# Patient Record
Sex: Female | Born: 1955 | Race: White | Hispanic: No | Marital: Married | Smoking: Never smoker
Health system: Southern US, Community
[De-identification: ages and names within clinical notes are randomized; demographics above are authoritative.]

## PROBLEM LIST (undated history)

## (undated) DIAGNOSIS — E559 Vitamin D deficiency, unspecified: Secondary | ICD-10-CM

## (undated) DIAGNOSIS — I4891 Unspecified atrial fibrillation: Secondary | ICD-10-CM

## (undated) DIAGNOSIS — I2089 Other forms of angina pectoris: Secondary | ICD-10-CM

## (undated) DIAGNOSIS — K219 Gastro-esophageal reflux disease without esophagitis: Secondary | ICD-10-CM

## (undated) DIAGNOSIS — I208 Other forms of angina pectoris: Secondary | ICD-10-CM

## (undated) DIAGNOSIS — N950 Postmenopausal bleeding: Secondary | ICD-10-CM

## (undated) DIAGNOSIS — F419 Anxiety disorder, unspecified: Secondary | ICD-10-CM

## (undated) DIAGNOSIS — N951 Menopausal and female climacteric states: Secondary | ICD-10-CM

## (undated) DIAGNOSIS — R339 Retention of urine, unspecified: Secondary | ICD-10-CM

## (undated) DIAGNOSIS — J069 Acute upper respiratory infection, unspecified: Secondary | ICD-10-CM

## (undated) DIAGNOSIS — Z22322 Carrier or suspected carrier of Methicillin resistant Staphylococcus aureus: Secondary | ICD-10-CM

## (undated) DIAGNOSIS — J309 Allergic rhinitis, unspecified: Secondary | ICD-10-CM

## (undated) DIAGNOSIS — K9041 Non-celiac gluten sensitivity: Secondary | ICD-10-CM

## (undated) DIAGNOSIS — F39 Unspecified mood [affective] disorder: Secondary | ICD-10-CM

## (undated) HISTORY — PX: BREAST CYST ASPIRATION: SHX578

## (undated) HISTORY — DX: Unspecified mood (affective) disorder: F39

## (undated) HISTORY — PX: DILATION AND CURETTAGE, DIAGNOSTIC / THERAPEUTIC: SUR384

## (undated) HISTORY — DX: Allergic rhinitis, unspecified: J30.9

## (undated) HISTORY — PX: CHOLECYSTECTOMY: SHX55

## (undated) HISTORY — DX: Postmenopausal bleeding: N95.0

## (undated) HISTORY — DX: Vitamin D deficiency, unspecified: E55.9

## (undated) HISTORY — DX: Other forms of angina pectoris: I20.8

## (undated) HISTORY — DX: Retention of urine, unspecified: R33.9

## (undated) HISTORY — DX: Other forms of angina pectoris: I20.89

## (undated) HISTORY — PX: EXPLORATORY LAPAROTOMY: SUR591

## (undated) HISTORY — DX: Unspecified atrial fibrillation: I48.91

## (undated) HISTORY — DX: Acute upper respiratory infection, unspecified: J06.9

## (undated) HISTORY — PX: TONSILLECTOMY AND ADENOIDECTOMY: SUR1326

## (undated) HISTORY — DX: Carrier or suspected carrier of methicillin resistant Staphylococcus aureus: Z22.322

## (undated) HISTORY — DX: Menopausal and female climacteric states: N95.1

## (undated) HISTORY — DX: Non-celiac gluten sensitivity: K90.41

## (undated) HISTORY — DX: Anxiety disorder, unspecified: F41.9

## (undated) HISTORY — DX: Gastro-esophageal reflux disease without esophagitis: K21.9

---

## 1997-05-10 ENCOUNTER — Other Ambulatory Visit: Admission: RE | Admit: 1997-05-10 | Discharge: 1997-05-10 | Payer: Self-pay | Admitting: Gynecology

## 1997-05-13 ENCOUNTER — Other Ambulatory Visit: Admission: RE | Admit: 1997-05-13 | Discharge: 1997-05-13 | Payer: Self-pay | Admitting: Gynecology

## 1997-05-17 ENCOUNTER — Other Ambulatory Visit: Admission: RE | Admit: 1997-05-17 | Discharge: 1997-05-17 | Payer: Self-pay | Admitting: Gynecology

## 1997-08-11 ENCOUNTER — Ambulatory Visit (HOSPITAL_COMMUNITY): Admission: RE | Admit: 1997-08-11 | Discharge: 1997-08-11 | Payer: Self-pay | Admitting: Obstetrics and Gynecology

## 1997-11-29 ENCOUNTER — Inpatient Hospital Stay (HOSPITAL_COMMUNITY): Admission: AD | Admit: 1997-11-29 | Discharge: 1997-11-29 | Payer: Self-pay | Admitting: Obstetrics and Gynecology

## 1997-12-01 ENCOUNTER — Inpatient Hospital Stay (HOSPITAL_COMMUNITY): Admission: AD | Admit: 1997-12-01 | Discharge: 1997-12-01 | Payer: Self-pay | Admitting: *Deleted

## 1997-12-02 ENCOUNTER — Inpatient Hospital Stay (HOSPITAL_COMMUNITY): Admission: AD | Admit: 1997-12-02 | Discharge: 1997-12-02 | Payer: Self-pay

## 1997-12-30 ENCOUNTER — Inpatient Hospital Stay (HOSPITAL_COMMUNITY): Admission: AD | Admit: 1997-12-30 | Discharge: 1998-01-02 | Payer: Self-pay | Admitting: Obstetrics & Gynecology

## 1998-01-05 ENCOUNTER — Encounter (HOSPITAL_COMMUNITY): Admission: RE | Admit: 1998-01-05 | Discharge: 1998-04-05 | Payer: Self-pay | Admitting: Obstetrics and Gynecology

## 1998-11-17 ENCOUNTER — Ambulatory Visit (HOSPITAL_COMMUNITY): Admission: RE | Admit: 1998-11-17 | Discharge: 1998-11-17 | Payer: Self-pay | Admitting: Obstetrics and Gynecology

## 1998-11-17 ENCOUNTER — Encounter: Payer: Self-pay | Admitting: Obstetrics and Gynecology

## 1999-04-11 ENCOUNTER — Other Ambulatory Visit: Admission: RE | Admit: 1999-04-11 | Discharge: 1999-04-11 | Payer: Self-pay | Admitting: Obstetrics and Gynecology

## 1999-11-22 ENCOUNTER — Ambulatory Visit (HOSPITAL_COMMUNITY): Admission: RE | Admit: 1999-11-22 | Discharge: 1999-11-22 | Payer: Self-pay | Admitting: Obstetrics and Gynecology

## 1999-11-22 ENCOUNTER — Encounter: Payer: Self-pay | Admitting: Obstetrics and Gynecology

## 1999-11-29 ENCOUNTER — Encounter: Payer: Self-pay | Admitting: Obstetrics and Gynecology

## 1999-11-29 ENCOUNTER — Encounter: Admission: RE | Admit: 1999-11-29 | Discharge: 1999-11-29 | Payer: Self-pay | Admitting: Obstetrics and Gynecology

## 2000-07-29 ENCOUNTER — Other Ambulatory Visit: Admission: RE | Admit: 2000-07-29 | Discharge: 2000-07-29 | Payer: Self-pay | Admitting: Obstetrics and Gynecology

## 2002-03-23 ENCOUNTER — Other Ambulatory Visit: Admission: RE | Admit: 2002-03-23 | Discharge: 2002-03-23 | Payer: Self-pay | Admitting: Gynecology

## 2002-03-23 ENCOUNTER — Encounter: Payer: Self-pay | Admitting: Gynecology

## 2002-03-23 ENCOUNTER — Ambulatory Visit (HOSPITAL_COMMUNITY): Admission: RE | Admit: 2002-03-23 | Discharge: 2002-03-23 | Payer: Self-pay | Admitting: Gynecology

## 2002-11-24 ENCOUNTER — Encounter: Admission: RE | Admit: 2002-11-24 | Discharge: 2002-11-24 | Payer: Self-pay | Admitting: Internal Medicine

## 2003-01-20 ENCOUNTER — Encounter (INDEPENDENT_AMBULATORY_CARE_PROVIDER_SITE_OTHER): Payer: Self-pay | Admitting: Specialist

## 2003-01-20 ENCOUNTER — Observation Stay (HOSPITAL_COMMUNITY): Admission: RE | Admit: 2003-01-20 | Discharge: 2003-01-21 | Payer: Self-pay | Admitting: Surgery

## 2003-08-16 ENCOUNTER — Ambulatory Visit (HOSPITAL_COMMUNITY): Admission: RE | Admit: 2003-08-16 | Discharge: 2003-08-16 | Payer: Self-pay | Admitting: Gynecology

## 2003-09-08 ENCOUNTER — Other Ambulatory Visit: Admission: RE | Admit: 2003-09-08 | Discharge: 2003-09-08 | Payer: Self-pay | Admitting: Gynecology

## 2004-11-28 ENCOUNTER — Ambulatory Visit (HOSPITAL_COMMUNITY): Admission: RE | Admit: 2004-11-28 | Discharge: 2004-11-28 | Payer: Self-pay | Admitting: Gynecology

## 2006-04-11 ENCOUNTER — Ambulatory Visit (HOSPITAL_COMMUNITY): Admission: RE | Admit: 2006-04-11 | Discharge: 2006-04-11 | Payer: Self-pay | Admitting: Family Medicine

## 2006-10-20 ENCOUNTER — Ambulatory Visit (HOSPITAL_COMMUNITY): Admission: RE | Admit: 2006-10-20 | Discharge: 2006-10-20 | Payer: Self-pay | Admitting: Gynecology

## 2006-12-30 ENCOUNTER — Other Ambulatory Visit: Admission: RE | Admit: 2006-12-30 | Discharge: 2006-12-30 | Payer: Self-pay | Admitting: Gynecology

## 2007-02-03 ENCOUNTER — Ambulatory Visit (HOSPITAL_COMMUNITY): Admission: RE | Admit: 2007-02-03 | Discharge: 2007-02-03 | Payer: Self-pay | Admitting: Gynecology

## 2007-07-24 ENCOUNTER — Ambulatory Visit (HOSPITAL_COMMUNITY): Admission: RE | Admit: 2007-07-24 | Discharge: 2007-07-24 | Payer: Self-pay | Admitting: Gynecology

## 2007-08-03 ENCOUNTER — Ambulatory Visit (HOSPITAL_COMMUNITY): Admission: RE | Admit: 2007-08-03 | Discharge: 2007-08-03 | Payer: Self-pay | Admitting: Gynecology

## 2008-08-12 ENCOUNTER — Ambulatory Visit (HOSPITAL_COMMUNITY): Admission: RE | Admit: 2008-08-12 | Discharge: 2008-08-12 | Payer: Self-pay | Admitting: Family Medicine

## 2009-11-22 ENCOUNTER — Encounter
Admission: RE | Admit: 2009-11-22 | Discharge: 2010-01-04 | Payer: Self-pay | Source: Home / Self Care | Attending: Family Medicine | Admitting: Family Medicine

## 2010-01-16 ENCOUNTER — Ambulatory Visit: Admit: 2010-01-16 | Payer: Self-pay | Admitting: Sports Medicine

## 2010-02-05 ENCOUNTER — Encounter: Payer: Self-pay | Admitting: Gynecology

## 2010-06-01 NOTE — Op Note (Signed)
NAME:  Jillian Graves, [facility]                           ACCOUNT NO.:  [id number]   MEDICAL RECORD NO.:  [id number]                   PATIENT TYPE:  AMB   LOCATION:  DAY                                  FACILITY:  [facility]   PHYSICIAN:  Sandria Bales. Ezzard Standing, M.D.               DATE OF BIRTH:  12/19/1955   DATE OF PROCEDURE:  01/20/2003  DATE OF DISCHARGE:                                 OPERATIVE REPORT   PREOPERATIVE DIAGNOSIS:  Chronic cholecystitis, cholelithiasis.   POSTOPERATIVE DIAGNOSIS:  Chronic cholecystitis, cholelithiasis.   OPERATION/PROCEDURE:  Laparoscopic cholecystectomy with intraoperative  cholangiogram.   SURGEON:  Sandria Bales. Ezzard Standing, M.D.   FIRST ASSISTANT:  Adolph Pollack, M.D.   ANESTHESIA:  General endotracheal anesthesia.   ESTIMATED BLOOD LOSS:  Minimal.   INDICATIONS:  Mrs. 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  I  discussed with her the indications and potential complications of  gallbladder surgery.  The potential complications include but are not  limited to bleeding, infection, bile duct leak, and open surgery.  She has  actually had some recent symptoms in the last two weeks and originally was  trying to schedule this for March 2005, but has moved her surgery up to  today.   DESCRIPTION OF PROCEDURE:  She now comes to the operating room, underwent a  general endotracheal anesthesia.  This was supervised by Dr. Sherrian Divers.  She has PAS stockings in place.  Her abdomen was prepped with Betadine  solution and sterilely draped.   An infraumbilical incision was made with sharp dissection and carried down  into the abdominal cavity.  A 12 mm Hasson trocar was secured with a 0  Vicryl suture and a 0-degree laparoscope was inserted into the abdominal  cavity.  The abdomen was explored.  The right stomach was unremarkable.  Left lobes of the liver were unremarkable.  She did have  about a 7-8 mm  granuloma on the dome of the right lower of the liver but this appeared to  entirely benign.  She had no other abnormal findings of her liver.  Her  gallbladder had some thickening and pale whiteness to it and some scarring  to the duodenum consistent with chronic cholecystitis.  The bowel, which  could be seen, appeared normal.  Down in the pelvis I was able to see both  cornua.  She had no evidence of hernia but I did not get down to her pelvic  organs.   Three additional trocars were placed.  A 2 mm Ethicon trocar in the  subxiphoid location, a 5 mm Ethicon trocar in the mid subcostal location and  a 5 mm Ethicon trocar in the right lateral subcostal location.  The  gallbladder was grasped, rotated cephalad.  She had adhesions up about a  third of the gallbladder wall.  These were  taken down with sharp scissors  and Bovie electrocautery.  I then found the cystic duct and cystic artery.  I clipped the cystic duct on the gallbladder side and shot an intraoperative  cholangiogram.   The intraoperative cholangiogram was shot using a cut-off Taut catheter  inserted through a 14-gauge Jelco catheter.  ___________ was selected and  secured with an endo-clip.  The intraoperative cholangiogram was shot with 6  mL of half-strength Hypaque solution with free flow down the cystic duct,  down the common bile duct into the duodenum and refluxed up into both  hepatic right angles, so there was no filling defect within the common bile  duct.  This was felt to be a normal intraoperative cholangiogram. The Taut  catheter was then removed.   The cystic duct was triply endoclipped and divided.  The cystic artery was  then identified, triply clipped and divided.  There were at least maybe two  other branches of the cystic artery in the gallbladder which were identified  and clipped a single time.   The triangle of Calot was visualized.  The gallbladder was then sharply and  bluntly  dissected from the gallbladder bed and this was using a ___________  hook Bovie coagulation providing complete division of the gallbladder from  the gallbladder bed.  The triangle of Calot and the gallbladder bed were  visualized.  There was no bleeding, no bile so the gallbladder was divided  and placed into a laparoscopic bag and delivered through the umbilicus.  The  abdomen was irrigated with about 500 mL of saline.  There was no additional  bleeding or bile leak.  Trocars were then removed in turn.  The umbilical  port was closed with the 0 Vicryl suture which was there.  There was no  bleeding from any port site.  The skin at each site was closed with running  subcuticular Vicryl suture.  Each port was painted with tincture of Benzoin  and steri-stripped.   The patient tolerated the procedure well.  Sponge and needle counts were  correct at the end of the case.  She was transferred to the recovery room in  good condition.                                               Sandria Bales. Ezzard Standing, M.D.    DHN/MEDQ  D:  01/20/2003  T:  01/20/2003  Job:  [id number]   cc:   Sharlet Salina, M.D.  [address]  [place]  [place] [address]  Fax: [phone]

## 2010-10-18 ENCOUNTER — Encounter: Payer: Self-pay | Admitting: Family Medicine

## 2010-10-18 ENCOUNTER — Ambulatory Visit (INDEPENDENT_AMBULATORY_CARE_PROVIDER_SITE_OTHER): Payer: 59 | Admitting: Family Medicine

## 2010-10-18 VITALS — Ht 63.0 in | Wt 125.0 lb

## 2010-10-18 DIAGNOSIS — M25569 Pain in unspecified knee: Secondary | ICD-10-CM

## 2010-10-18 DIAGNOSIS — M25561 Pain in right knee: Secondary | ICD-10-CM

## 2010-10-18 DIAGNOSIS — S83249A Other tear of medial meniscus, current injury, unspecified knee, initial encounter: Secondary | ICD-10-CM

## 2010-10-18 DIAGNOSIS — IMO0002 Reserved for concepts with insufficient information to code with codable children: Secondary | ICD-10-CM

## 2010-10-19 ENCOUNTER — Other Ambulatory Visit (HOSPITAL_COMMUNITY): Payer: Self-pay | Admitting: Gynecology

## 2010-10-19 DIAGNOSIS — Z1231 Encounter for screening mammogram for malignant neoplasm of breast: Secondary | ICD-10-CM

## 2010-10-19 NOTE — Progress Notes (Signed)
  Subjective:    Patient ID: Jillian Graves, female    DOB: 10/03/1955, 55 y.o.   MRN: [id number]  HPI  Ms. Uvaldo Rising is a pleasant 55yo female patient complaining of right knee pain for at least 1 year. She is a physician, she is a runner. She is not running much due to her knee pain. She states that the pain is located on her medial knee joint, 5/10 intensity,on and off, not radiated, worse with weight bearing activities and running, improve by rest, swelling on and off. She is having mechanical symptoms as catching and locking.   There is no problem list on file for this patient.  No current outpatient prescriptions on file prior to visit.   Allergies  Allergen Reactions  . Codeine Nausea And Vomiting  . Latex Swelling    Facial swelling   History  Substance Use Topics  . Smoking status: Never Smoker   . Smokeless tobacco: Never Used  . Alcohol Use: Not on file    Review of Systems  Constitutional: Negative for fever, chills, diaphoresis and fatigue.  Musculoskeletal: Positive for joint swelling. Negative for back pain and gait problem.  Neurological: Negative for weakness and numbness.       Objective:   Physical Exam  Constitutional: She appears well-developed and well-nourished.       Ht 5\' 3"  (1.6 m)  Wt 125 lb (56.7 kg)  BMI 22.14 kg/m2   Pulmonary/Chest: Effort normal.  Musculoskeletal:       Right knee with intact skin, FROM. Patellofemoral crepitus present with flexion and extension. Patellofemoral compression  test +. TTP in mid joint line. Mcmurray positive, thesally negative. No tenderness on the quad neither or patellar tendon. Ligaments intact. Lachman neg. Varus and valgus test at 0 and 30 degres neg Poor quad muscle definition.  Normal gait without a limp. Feet with mid arch.    Neurological: She is alert.  Skin: Skin is warm. No rash noted. No erythema.  Psychiatric: She has a normal mood and affect.    MSK U/S : R knee with hypoechoic area in the  medial meniscus compatible with medial meniscus tear in medial and posterior view.        Assessment & Plan:   1. Right knee pain   2. Acute medial meniscal tear    Aleeve PRN Body hellix knee sleeve F/U orthopaedic ( Dr. Thurston Hole)

## 2010-11-01 ENCOUNTER — Ambulatory Visit (HOSPITAL_COMMUNITY): Payer: 59

## 2010-11-01 ENCOUNTER — Ambulatory Visit (HOSPITAL_COMMUNITY)
Admission: RE | Admit: 2010-11-01 | Discharge: 2010-11-01 | Disposition: A | Discharge status: Home / Self Care | Payer: 59 | Source: Ambulatory Visit | Attending: Gynecology | Admitting: Gynecology

## 2010-11-01 DIAGNOSIS — Z1231 Encounter for screening mammogram for malignant neoplasm of breast: Secondary | ICD-10-CM | POA: Insufficient documentation

## 2011-03-05 ENCOUNTER — Encounter: Payer: Self-pay | Admitting: Pulmonary Disease

## 2011-12-16 ENCOUNTER — Other Ambulatory Visit (HOSPITAL_COMMUNITY): Payer: Self-pay | Admitting: Gynecology

## 2011-12-16 DIAGNOSIS — Z1231 Encounter for screening mammogram for malignant neoplasm of breast: Secondary | ICD-10-CM

## 2012-01-02 ENCOUNTER — Ambulatory Visit (HOSPITAL_COMMUNITY)
Admission: RE | Admit: 2012-01-02 | Discharge: 2012-01-02 | Disposition: A | Discharge status: Home / Self Care | Payer: 59 | Source: Ambulatory Visit | Attending: Gynecology | Admitting: Gynecology

## 2012-01-02 DIAGNOSIS — Z1231 Encounter for screening mammogram for malignant neoplasm of breast: Secondary | ICD-10-CM | POA: Insufficient documentation

## 2012-12-28 ENCOUNTER — Other Ambulatory Visit (HOSPITAL_COMMUNITY): Payer: Self-pay | Admitting: Gynecology

## 2012-12-28 DIAGNOSIS — Z1231 Encounter for screening mammogram for malignant neoplasm of breast: Secondary | ICD-10-CM

## 2013-01-20 ENCOUNTER — Ambulatory Visit (HOSPITAL_COMMUNITY)
Admission: RE | Admit: 2013-01-20 | Discharge: 2013-01-20 | Disposition: A | Discharge status: Home / Self Care | Payer: BC Managed Care – PPO | Source: Ambulatory Visit | Attending: Gynecology | Admitting: Gynecology

## 2013-01-20 DIAGNOSIS — Z1231 Encounter for screening mammogram for malignant neoplasm of breast: Secondary | ICD-10-CM

## 2014-10-21 ENCOUNTER — Other Ambulatory Visit: Payer: Self-pay

## 2014-10-21 DIAGNOSIS — Z1231 Encounter for screening mammogram for malignant neoplasm of breast: Secondary | ICD-10-CM

## 2014-11-11 ENCOUNTER — Ambulatory Visit
Admission: RE | Admit: 2014-11-11 | Discharge: 2014-11-11 | Disposition: A | Discharge status: Home / Self Care | Payer: BLUE CROSS/BLUE SHIELD | Source: Ambulatory Visit

## 2014-11-11 DIAGNOSIS — Z1231 Encounter for screening mammogram for malignant neoplasm of breast: Secondary | ICD-10-CM

## 2016-02-22 DIAGNOSIS — L219 Seborrheic dermatitis, unspecified: Secondary | ICD-10-CM | POA: Diagnosis not present

## 2016-02-22 DIAGNOSIS — L578 Other skin changes due to chronic exposure to nonionizing radiation: Secondary | ICD-10-CM | POA: Diagnosis not present

## 2016-03-15 ENCOUNTER — Other Ambulatory Visit: Payer: Self-pay | Admitting: Obstetrics and Gynecology

## 2016-03-15 DIAGNOSIS — Z1231 Encounter for screening mammogram for malignant neoplasm of breast: Secondary | ICD-10-CM

## 2016-03-18 DIAGNOSIS — Z0000 Encounter for general adult medical examination without abnormal findings: Secondary | ICD-10-CM | POA: Diagnosis not present

## 2016-03-18 DIAGNOSIS — F39 Unspecified mood [affective] disorder: Secondary | ICD-10-CM | POA: Diagnosis not present

## 2016-03-18 DIAGNOSIS — J309 Allergic rhinitis, unspecified: Secondary | ICD-10-CM | POA: Diagnosis not present

## 2016-03-20 DIAGNOSIS — F39 Unspecified mood [affective] disorder: Secondary | ICD-10-CM | POA: Diagnosis not present

## 2016-03-20 DIAGNOSIS — E559 Vitamin D deficiency, unspecified: Secondary | ICD-10-CM | POA: Diagnosis not present

## 2016-03-20 DIAGNOSIS — Z0000 Encounter for general adult medical examination without abnormal findings: Secondary | ICD-10-CM | POA: Diagnosis not present

## 2016-03-29 ENCOUNTER — Ambulatory Visit: Payer: BLUE CROSS/BLUE SHIELD

## 2016-04-19 ENCOUNTER — Ambulatory Visit: Payer: BLUE CROSS/BLUE SHIELD

## 2016-04-26 ENCOUNTER — Ambulatory Visit
Admission: RE | Admit: 2016-04-26 | Discharge: 2016-04-26 | Disposition: A | Discharge status: Home / Self Care | Payer: BLUE CROSS/BLUE SHIELD | Source: Ambulatory Visit | Attending: Obstetrics and Gynecology | Admitting: Obstetrics and Gynecology

## 2016-04-26 DIAGNOSIS — Z1231 Encounter for screening mammogram for malignant neoplasm of breast: Secondary | ICD-10-CM | POA: Diagnosis not present

## 2016-04-26 DIAGNOSIS — D220 Melanocytic nevi of lip: Secondary | ICD-10-CM | POA: Diagnosis not present

## 2016-04-26 DIAGNOSIS — L738 Other specified follicular disorders: Secondary | ICD-10-CM | POA: Diagnosis not present

## 2016-07-03 DIAGNOSIS — E559 Vitamin D deficiency, unspecified: Secondary | ICD-10-CM | POA: Diagnosis not present

## 2016-09-18 DIAGNOSIS — H40013 Open angle with borderline findings, low risk, bilateral: Secondary | ICD-10-CM | POA: Diagnosis not present

## 2016-09-18 DIAGNOSIS — H47323 Drusen of optic disc, bilateral: Secondary | ICD-10-CM | POA: Diagnosis not present

## 2016-09-18 DIAGNOSIS — H43813 Vitreous degeneration, bilateral: Secondary | ICD-10-CM | POA: Diagnosis not present

## 2016-09-18 DIAGNOSIS — H04123 Dry eye syndrome of bilateral lacrimal glands: Secondary | ICD-10-CM | POA: Diagnosis not present

## 2016-10-23 DIAGNOSIS — D225 Melanocytic nevi of trunk: Secondary | ICD-10-CM | POA: Diagnosis not present

## 2016-10-23 DIAGNOSIS — D2362 Other benign neoplasm of skin of left upper limb, including shoulder: Secondary | ICD-10-CM | POA: Diagnosis not present

## 2016-10-23 DIAGNOSIS — D224 Melanocytic nevi of scalp and neck: Secondary | ICD-10-CM | POA: Diagnosis not present

## 2016-10-23 DIAGNOSIS — D2272 Melanocytic nevi of left lower limb, including hip: Secondary | ICD-10-CM | POA: Diagnosis not present

## 2016-11-04 DIAGNOSIS — D126 Benign neoplasm of colon, unspecified: Secondary | ICD-10-CM | POA: Diagnosis not present

## 2016-11-04 DIAGNOSIS — Z1211 Encounter for screening for malignant neoplasm of colon: Secondary | ICD-10-CM | POA: Diagnosis not present

## 2016-11-07 DIAGNOSIS — D126 Benign neoplasm of colon, unspecified: Secondary | ICD-10-CM | POA: Diagnosis not present

## 2017-05-16 DIAGNOSIS — M549 Dorsalgia, unspecified: Secondary | ICD-10-CM | POA: Diagnosis not present

## 2017-05-16 DIAGNOSIS — M40209 Unspecified kyphosis, site unspecified: Secondary | ICD-10-CM | POA: Diagnosis not present

## 2017-05-16 DIAGNOSIS — I4891 Unspecified atrial fibrillation: Secondary | ICD-10-CM | POA: Diagnosis not present

## 2017-05-16 DIAGNOSIS — I2510 Atherosclerotic heart disease of native coronary artery without angina pectoris: Secondary | ICD-10-CM | POA: Diagnosis not present

## 2017-05-21 ENCOUNTER — Other Ambulatory Visit: Payer: Self-pay | Admitting: Obstetrics and Gynecology

## 2017-05-21 DIAGNOSIS — Z1231 Encounter for screening mammogram for malignant neoplasm of breast: Secondary | ICD-10-CM

## 2017-06-03 DIAGNOSIS — Z124 Encounter for screening for malignant neoplasm of cervix: Secondary | ICD-10-CM | POA: Diagnosis not present

## 2017-06-03 DIAGNOSIS — Z01419 Encounter for gynecological examination (general) (routine) without abnormal findings: Secondary | ICD-10-CM | POA: Diagnosis not present

## 2017-06-03 DIAGNOSIS — Z780 Asymptomatic menopausal state: Secondary | ICD-10-CM | POA: Diagnosis not present

## 2017-06-03 DIAGNOSIS — R609 Edema, unspecified: Secondary | ICD-10-CM | POA: Diagnosis not present

## 2017-06-03 DIAGNOSIS — R87610 Atypical squamous cells of undetermined significance on cytologic smear of cervix (ASC-US): Secondary | ICD-10-CM | POA: Diagnosis not present

## 2017-06-03 DIAGNOSIS — Z6824 Body mass index (BMI) 24.0-24.9, adult: Secondary | ICD-10-CM | POA: Diagnosis not present

## 2017-06-06 ENCOUNTER — Ambulatory Visit
Admission: RE | Admit: 2017-06-06 | Discharge: 2017-06-06 | Disposition: A | Discharge status: Home / Self Care | Payer: PRIVATE HEALTH INSURANCE | Source: Ambulatory Visit | Attending: Obstetrics and Gynecology | Admitting: Obstetrics and Gynecology

## 2017-06-06 DIAGNOSIS — Z1231 Encounter for screening mammogram for malignant neoplasm of breast: Secondary | ICD-10-CM

## 2017-11-19 DIAGNOSIS — D225 Melanocytic nevi of trunk: Secondary | ICD-10-CM | POA: Diagnosis not present

## 2017-11-19 DIAGNOSIS — D2362 Other benign neoplasm of skin of left upper limb, including shoulder: Secondary | ICD-10-CM | POA: Diagnosis not present

## 2017-11-19 DIAGNOSIS — D2272 Melanocytic nevi of left lower limb, including hip: Secondary | ICD-10-CM | POA: Diagnosis not present

## 2017-11-19 DIAGNOSIS — D224 Melanocytic nevi of scalp and neck: Secondary | ICD-10-CM | POA: Diagnosis not present

## 2017-12-03 DIAGNOSIS — H40013 Open angle with borderline findings, low risk, bilateral: Secondary | ICD-10-CM | POA: Diagnosis not present

## 2017-12-03 DIAGNOSIS — H43813 Vitreous degeneration, bilateral: Secondary | ICD-10-CM | POA: Diagnosis not present

## 2017-12-03 DIAGNOSIS — M3501 Sicca syndrome with keratoconjunctivitis: Secondary | ICD-10-CM | POA: Diagnosis not present

## 2017-12-03 DIAGNOSIS — H04123 Dry eye syndrome of bilateral lacrimal glands: Secondary | ICD-10-CM | POA: Diagnosis not present

## 2018-09-03 ENCOUNTER — Other Ambulatory Visit: Payer: Self-pay | Admitting: Family Medicine

## 2018-09-03 DIAGNOSIS — Z1231 Encounter for screening mammogram for malignant neoplasm of breast: Secondary | ICD-10-CM

## 2018-10-28 ENCOUNTER — Ambulatory Visit
Admission: RE | Admit: 2018-10-28 | Discharge: 2018-10-28 | Disposition: A | Discharge status: Home / Self Care | Payer: No Typology Code available for payment source | Source: Ambulatory Visit | Attending: Family Medicine | Admitting: Family Medicine

## 2018-10-28 ENCOUNTER — Other Ambulatory Visit: Payer: Self-pay

## 2018-10-28 DIAGNOSIS — Z1231 Encounter for screening mammogram for malignant neoplasm of breast: Secondary | ICD-10-CM

## 2019-11-03 ENCOUNTER — Ambulatory Visit (INDEPENDENT_AMBULATORY_CARE_PROVIDER_SITE_OTHER): Payer: PRIVATE HEALTH INSURANCE | Admitting: Cardiology

## 2019-11-03 ENCOUNTER — Encounter: Payer: Self-pay | Admitting: Cardiology

## 2019-11-03 ENCOUNTER — Other Ambulatory Visit: Payer: Self-pay

## 2019-11-03 VITALS — BP 130/72 | HR 65 | Ht 63.0 in | Wt 128.0 lb

## 2019-11-03 DIAGNOSIS — Z01812 Encounter for preprocedural laboratory examination: Secondary | ICD-10-CM

## 2019-11-03 DIAGNOSIS — R072 Precordial pain: Secondary | ICD-10-CM

## 2019-11-03 MED ORDER — METOPROLOL TARTRATE 100 MG PO TABS: ORAL_TABLET | ORAL | 0 refills | Status: AC

## 2019-11-03 NOTE — Progress Notes (Signed)
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 Maurice Small.  She has been experiencing chest/shoulder discomfort with significant exercise.  In review of Dr. Jone Baseman note from 09/22/2019 she was undergoing an intense exercise program and noted some chest and shoulder pain during these exercises.  The symptoms seem to resolve with rest.  There was inquiry about possible cardiac CT.  Has had prior history of elevated LDL.  HR 160 > heart burn, deep ache left shoulder. Felt it agin, 2 weeks or so. Rest goes away. 140 no problems.   LDL 140 total cholesterol 419 HDL 70.  Hemoglobin 13.8  Her maternal grandmother had heart disease. Mother had AFIB. Small CVA. Later in life.  Otherwise no early family history of CAD.  PFTs were done with FEV1 of 86%   She does take hormone replacement therapy.  Past Medical History:  Diagnosis Date  . Allergic rhinitis   . Anginal equivalent (HCC)   . Anxiety   . Atrial fibrillation (HCC)   . Constipation   . Dysphagia   . GERD (gastroesophageal reflux disease)   . Gluten intolerance   . Gluten intolerance   . Menopausal and female climacteric states   . Mood disorder (HCC)   . MRSA (methicillin resistant staph aureus) culture positive   . Post-menopausal bleeding   . Urinary retention   . Viral upper respiratory infection    influenza  . Vitamin D deficiency     Past Surgical History:  Procedure Laterality Date  . BREAST CYST ASPIRATION    . CHOLECYSTECTOMY    . DILATION AND CURETTAGE, DIAGNOSTIC / THERAPEUTIC    . EXPLORATORY LAPAROTOMY    . TONSILLECTOMY AND ADENOIDECTOMY      Current  Medications: Current Meds  Medication Sig  . Cholecalciferol (VITAMIN D3) 25 MCG (1000 UT) CAPS Take by mouth.  . estradiol (CLIMARA - DOSED IN MG/24 HR) 0.05 mg/24hr patch Place 0.05 mg onto the skin 2 (two) times a week.  Marland Kitchen levonorgestrel (MIRENA) 20 MCG/24HR IUD 1 each by Intrauterine route once.  . Lifitegrast 5 % SOLN Apply to eye.  . Probiotic Product (ALIGN) 4 MG CAPS Take 4 mg by mouth daily.    Marland Kitchen TART CHERRY PO Take by mouth.     Allergies:   Codeine, Gluten meal, Latex, Propylene glycol, and Voltaren [diclofenac]   Social History   Socioeconomic History  . Marital status: Married    Spouse name: Not on file  . Number of children: Not on file  . Years of education: Not on file  . Highest education level: Not on file  Occupational History  . Not on file  Tobacco Use  . Smoking status: Never Smoker  . Smokeless tobacco: Never Used  Substance and Sexual Activity  . Alcohol use: Not on file  . Drug use: Not on file  . Sexual activity: Not on file  Other Topics Concern  . Not on file  Social History Narrative  . Not on file   Social Determinants of Health   Financial Resource Strain:   . Difficulty of Paying Living  Expenses: Not on file  Food Insecurity:   . Worried About Charity fundraiser in the Last Year: Not on file  . Ran Out of Food in the Last Year: Not on file  Transportation Needs:   . Lack of Transportation (Medical): Not on file  . Lack of Transportation (Non-Medical): Not on file  Physical Activity:   . Days of Exercise per Week: Not on file  . Minutes of Exercise per Session: Not on file  Stress:   . Feeling of Stress : Not on file  Social Connections:   . Frequency of Communication with Friends and Family: Not on file  . Frequency of Social Gatherings with Friends and Family: Not on file  . Attends Religious Services: Not on file  . Active Member of Clubs or Organizations: Not on file  . Attends Archivist Meetings: Not on file  .  Marital Status: Not on file     Family History: The patient's family history includes COPD in her brother and father; Cancer in her paternal grandfather; Cancer - Lung in her father; Cancer - Prostate in her maternal grandfather; Diabetes in her paternal grandmother; Eczema in her mother; Glaucoma in her mother; Heart disease in her maternal grandmother; Hyperlipidemia in her brother and father; Hypertension in her brother, father, paternal grandfather, and paternal grandmother; Osteoarthritis in her mother. There is no history of Breast cancer.  ROS:   Please see the history of present illness.     All other systems reviewed and are negative.  EKGs/Labs/Other Studies Reviewed:    The following studies were reviewed today: Prior office notes as above.  Lab work as above.  EKG:  EKG is  ordered today.  The ekg ordered today demonstrates sinus rhythm 65 left axis deviation  Recent Labs: No results found for requested labs within last 8760 hours.  Recent Lipid Panel No results found for: CHOL, TRIG, HDL, CHOLHDL, VLDL, LDLCALC, LDLDIRECT   Risk Assessment/Calculations:       Physical Exam:    VS:  BP 130/72   Pulse 65   Ht $R'5\' 3"'rk$  (1.6 m)   Wt 128 lb (58.1 kg)   SpO2 97%   BMI 22.67 kg/m     Wt Readings from Last 3 Encounters:  11/03/19 128 lb (58.1 kg)  10/18/10 125 lb (56.7 kg)     GEN:  Well nourished, well developed in no acute distress HEENT: Normal NECK: No JVD; No carotid bruits LYMPHATICS: No lymphadenopathy CARDIAC: RRR, soft 1/6 flow murmur, no rubs, gallops RESPIRATORY:  Clear to auscultation without rales, wheezing or rhonchi  ABDOMEN: Soft, non-tender, non-distended MUSCULOSKELETAL:  No edema; No deformity  SKIN: Warm and dry NEUROLOGIC:  Alert and oriented x 3 PSYCHIATRIC:  Normal affect   ASSESSMENT:    1. Pre-procedure lab exam   2. Precordial pain    PLAN:    In order of problems listed above:  Chest pain -Chest discomfort/shoulder  discomfort was noted during heavy exercise efforts.  This could be anginal equivalent.  She has had LDL of approximately 140, hyperlipidemia.  Non-smoker, nondiabetic.  EKG demonstrates sinus rhythm without any significant ST segment changes.  Nonspecific T wave flattening/subtle inversion in V2, left axis deviation.  -We will go ahead and set her up for a coronary CT scan with possible FFR analysis. -I have asked her to curb her exercise efforts until we have results. -Possibilities for symptoms could be cardiac, possibly musculoskeletal.  I agree with further work-up.  Hyperlipidemia -LDL 140.  May need to initiate statin therapy based upon CT results.  Continue with diet and exercise.  Excellent.   Medication Adjustments/Labs and Tests Ordered: Current medicines are reviewed at length with the patient today.  Concerns regarding medicines are outlined above.  Orders Placed This Encounter  Procedures  . CT CORONARY MORPH W/CTA COR W/SCORE W/CA W/CM &/OR WO/CM  . CT CORONARY FRACTIONAL FLOW RESERVE DATA PREP  . CT CORONARY FRACTIONAL FLOW RESERVE FLUID ANALYSIS  . Basic metabolic panel  . EKG 12-Lead   Meds ordered this encounter  Medications  . metoprolol tartrate (LOPRESSOR) 100 MG tablet    Sig: Take one tablet by mouth 2 hours before ct    Dispense:  1 tablet    Refill:  0    Patient Instructions  Medication Instructions:  No changes *If you need a refill on your cardiac medications before your next appointment, please call your pharmacy*   Lab Work: None today  If you have labs (blood work) drawn today and your tests are completely normal, you will receive your results only by: Marland Kitchen MyChart Message (if you have MyChart) OR . A paper copy in the mail If you have any lab test that is abnormal or we need to change your treatment, we will call you to review the results.   Testing/Procedures: See below for cardiac ct angiogram instructions  Follow-Up: Follow up with your  physician will depend on test results.  Other Instructions Your cardiac CT will be scheduled at one of the below locations:   [facility] [address] [place], [place] [address] [phone]  [place] [address] [place], [place] [address] [phone]  If scheduled at [facility], please arrive at the [facility] main entrance of [facility] 30 minutes prior to test start time. Proceed to the [facility] Radiology Department (first floor) to check-in and test prep.  If scheduled at [facility], please arrive 15 mins early for check-in and test prep.  Please follow these instructions carefully (unless otherwise directed):   On the Night Before the Test: . Be sure to Drink plenty of water. . Do not consume any caffeinated/decaffeinated beverages or chocolate 12 hours prior to your test.  Do not take any antihistamines 12 hours prior to your  test.  On the Day of the Test: . Drink plenty of water. Do not drink any water within one hour of the test. . Do not eat any food 4 hours prior to the test. . You may take your regular medications prior to the test.  . Take metoprolol (Lopressor) two hours prior to test. . FEMALES- please wear underwire-free bra if available       After the Test: . Drink plenty of water. . After receiving IV contrast, you may experience a mild flushed feeling. This is normal. . On occasion, you may experience a mild rash up to 24 hours after the test. This is not dangerous. If this occurs, you can take Benadryl 25 mg and increase your fluid intake. . If you experience trouble breathing, this can be serious. If it is severe call 911 IMMEDIATELY. If it is mild, please call our office. . If you take any of these medications: Glipizide/Metformin, Avandament, Glucavance, please do not take 48 hours after completing test unless otherwise  instructed.   Once we have confirmed authorization from your insurance company, we will call  you to set up a date and time for your test. Based on how quickly your insurance processes prior authorizations requests, please allow up to 4 weeks to be contacted for scheduling your Cardiac CT appointment. Be advised that routine Cardiac CT appointments could be scheduled as many as 8 weeks after your provider has ordered it.  For non-scheduling related questions, please contact the cardiac imaging nurse navigator should you have any questions/concerns: Marchia Bond, Cardiac Imaging Nurse Navigator Burley Saver, Interim Cardiac Imaging Nurse [place] [facility] Direct Office Dial: [phone]   For scheduling needs, including cancellations and rescheduling, please call Vivien Rota at [phone], option 3.        Signed, Candee Furbish, MD  11/03/2019 4:53 PM    [facility]

## 2019-11-03 NOTE — Patient Instructions (Addendum)
Medication Instructions:  No changes *If you need a refill on your cardiac medications before your next appointment, please call your pharmacy*   Lab Work: None today  If you have labs (blood work) drawn today and your tests are completely normal, you will receive your results only by: Marland Kitchen MyChart Message (if you have MyChart) OR . A paper copy in the mail If you have any lab test that is abnormal or we need to change your treatment, we will call you to review the results.   Testing/Procedures: See below for cardiac ct angiogram instructions  Follow-Up: Follow up with your physician will depend on test results.  Other Instructions Your cardiac CT will be scheduled at one of the below locations:   [facility] [address] [place], [place] [address] [phone]  [place] [address] [place], [place] [address] [phone]  If scheduled at [facility], please arrive at the [facility] main entrance of [facility] 30 minutes prior to test start time. Proceed to the [facility] Radiology Department (first floor) to check-in and test prep.  If scheduled at [facility], please arrive 15 mins early for check-in and test prep.  Please follow these instructions carefully (unless otherwise directed):   On the Night Before the Test: . Be sure to Drink plenty of water. . Do not consume any caffeinated/decaffeinated beverages or chocolate 12 hours prior to your test.  Do not take any antihistamines 12 hours prior to your  test.  On the Day of the Test: . Drink plenty of water. Do not drink any water within one hour of the test. . Do not eat any food 4 hours prior to the test. . You may take your regular medications prior to the test.  . Take metoprolol (Lopressor) two hours prior to test. . FEMALES- please wear underwire-free bra if available       After the  Test: . Drink plenty of water. . After receiving IV contrast, you may experience a mild flushed feeling. This is normal. . On occasion, you may experience a mild rash up to 24 hours after the test. This is not dangerous. If this occurs, you can take Benadryl 25 mg and increase your fluid intake. . If you experience trouble breathing, this can be serious. If it is severe call 911 IMMEDIATELY. If it is mild, please call our office. . If you take any of these medications: Glipizide/Metformin, Avandament, Glucavance, please do not take 48 hours after completing test unless otherwise instructed.   Once we have confirmed authorization from your insurance company, we will call you to set up a date and time for your test. Based on how quickly your insurance processes prior authorizations requests, please allow up to 4 weeks to be contacted for scheduling your Cardiac CT appointment. Be advised that routine Cardiac CT appointments could be scheduled as many as 8 weeks after your provider has ordered it.  For non-scheduling related questions, please contact the cardiac imaging nurse navigator should you have any questions/concerns: Marchia Bond, Cardiac Imaging Nurse Navigator Burley Saver, Interim Cardiac Imaging Nurse [place] [facility] Direct Office Dial: [phone]   For scheduling needs, including cancellations and rescheduling, please call Vivien Rota at [phone], option 3.

## 2019-12-16 ENCOUNTER — Telehealth (HOSPITAL_COMMUNITY): Payer: Self-pay | Admitting: Emergency Medicine

## 2019-12-16 NOTE — Telephone Encounter (Signed)
Pt calling checking on the status of her cardiac CTA insurance approval for scheduling.   I reviewed the workque and shes still pending review as 12/07/19. Is there a new update available for her?  Rockwell Alexandria RN Navigator Cardiac Imaging [facility] [phone] Office  [phone] Cell

## 2020-01-03 ENCOUNTER — Telehealth (HOSPITAL_COMMUNITY): Payer: Self-pay | Admitting: Emergency Medicine

## 2020-01-03 NOTE — Telephone Encounter (Signed)
Attempted to call patient regarding upcoming cardiac CT appointment. °Left message on voicemail with name and callback number °Hrithik Boschee RN Navigator Cardiac Imaging °[place] [facility] °[phone] Office °[phone] Cell ° °

## 2020-01-04 ENCOUNTER — Telehealth (HOSPITAL_COMMUNITY): Payer: Self-pay | Admitting: *Deleted

## 2020-01-04 NOTE — Telephone Encounter (Signed)
Reaching out to patient to offer assistance regarding upcoming cardiac imaging study; pt verbalizes understanding of appt date/time, parking situation and where to check in, pre-test NPO status and medications ordered, and verified current allergies; name and call back number provided for further questions should they arise  [place] [facility] [phone] office [phone] cell  Pt may bring labs to appointment.

## 2020-01-05 ENCOUNTER — Ambulatory Visit (HOSPITAL_COMMUNITY)
Admission: RE | Admit: 2020-01-05 | Discharge: 2020-01-05 | Disposition: A | Discharge status: Home / Self Care | Payer: PRIVATE HEALTH INSURANCE | Source: Ambulatory Visit | Attending: Cardiology | Admitting: Cardiology

## 2020-01-05 ENCOUNTER — Other Ambulatory Visit: Payer: Self-pay

## 2020-01-05 DIAGNOSIS — R072 Precordial pain: Secondary | ICD-10-CM

## 2020-01-05 MED ORDER — NITROGLYCERIN 0.4 MG SL SUBL
SUBLINGUAL_TABLET | SUBLINGUAL | Status: AC
  Administered 2020-01-05: 15:00:00 0.8 mg
  Filled 2020-01-05: qty 2

## 2020-01-05 MED ORDER — IOHEXOL 350 MG/ML SOLN
80.0000 mL | Freq: Once | INTRAVENOUS | Status: AC | PRN
  Administered 2020-01-05: 80 mL via INTRAVENOUS

## 2020-01-10 ENCOUNTER — Other Ambulatory Visit: Payer: Self-pay | Admitting: Family Medicine

## 2020-01-10 DIAGNOSIS — Z1231 Encounter for screening mammogram for malignant neoplasm of breast: Secondary | ICD-10-CM

## 2020-01-12 ENCOUNTER — Other Ambulatory Visit: Payer: Self-pay

## 2020-01-12 ENCOUNTER — Ambulatory Visit
Admission: RE | Admit: 2020-01-12 | Discharge: 2020-01-12 | Disposition: A | Discharge status: Home / Self Care | Payer: PRIVATE HEALTH INSURANCE | Source: Ambulatory Visit

## 2020-01-12 DIAGNOSIS — Z1231 Encounter for screening mammogram for malignant neoplasm of breast: Secondary | ICD-10-CM

## 2020-01-13 ENCOUNTER — Other Ambulatory Visit: Payer: Self-pay | Admitting: Family Medicine

## 2020-01-13 DIAGNOSIS — R928 Other abnormal and inconclusive findings on diagnostic imaging of breast: Secondary | ICD-10-CM

## 2020-02-02 ENCOUNTER — Other Ambulatory Visit: Payer: Self-pay

## 2020-02-02 ENCOUNTER — Ambulatory Visit
Admission: RE | Admit: 2020-02-02 | Discharge: 2020-02-02 | Disposition: A | Discharge status: Home / Self Care | Payer: PRIVATE HEALTH INSURANCE | Source: Ambulatory Visit | Attending: Family Medicine | Admitting: Family Medicine

## 2020-02-02 DIAGNOSIS — R928 Other abnormal and inconclusive findings on diagnostic imaging of breast: Secondary | ICD-10-CM

## 2021-02-01 ENCOUNTER — Other Ambulatory Visit: Payer: Self-pay | Admitting: Family Medicine

## 2021-02-01 DIAGNOSIS — Z1231 Encounter for screening mammogram for malignant neoplasm of breast: Secondary | ICD-10-CM

## 2021-02-13 ENCOUNTER — Other Ambulatory Visit: Payer: Self-pay | Admitting: Obstetrics and Gynecology

## 2021-02-13 DIAGNOSIS — Z1231 Encounter for screening mammogram for malignant neoplasm of breast: Secondary | ICD-10-CM

## 2021-02-14 ENCOUNTER — Ambulatory Visit
Admission: RE | Admit: 2021-02-14 | Discharge: 2021-02-14 | Disposition: A | Discharge status: Home / Self Care | Payer: No Typology Code available for payment source | Source: Ambulatory Visit

## 2021-02-14 ENCOUNTER — Other Ambulatory Visit: Payer: Self-pay

## 2021-02-14 DIAGNOSIS — Z1231 Encounter for screening mammogram for malignant neoplasm of breast: Secondary | ICD-10-CM

## 2022-05-09 ENCOUNTER — Other Ambulatory Visit: Payer: Self-pay | Admitting: Obstetrics and Gynecology

## 2022-05-09 DIAGNOSIS — Z780 Asymptomatic menopausal state: Secondary | ICD-10-CM

## 2022-05-14 ENCOUNTER — Other Ambulatory Visit: Payer: Self-pay | Admitting: Obstetrics and Gynecology

## 2022-05-14 DIAGNOSIS — Z1231 Encounter for screening mammogram for malignant neoplasm of breast: Secondary | ICD-10-CM

## 2022-10-31 ENCOUNTER — Ambulatory Visit (HOSPITAL_BASED_OUTPATIENT_CLINIC_OR_DEPARTMENT_OTHER): Admit: 2022-10-31 | Payer: No Typology Code available for payment source | Admitting: Orthopaedic Surgery

## 2022-10-31 ENCOUNTER — Encounter (HOSPITAL_BASED_OUTPATIENT_CLINIC_OR_DEPARTMENT_OTHER): Payer: Self-pay

## 2022-10-31 SURGERY — ARTHROSCOPY, KNEE, WITH MEDIAL MENISCECTOMY
Anesthesia: General | Site: Knee | Laterality: Left

## 2022-11-29 ENCOUNTER — Ambulatory Visit
Admission: RE | Admit: 2022-11-29 | Discharge: 2022-11-29 | Disposition: A | Discharge status: Home / Self Care | Payer: No Typology Code available for payment source | Source: Ambulatory Visit | Attending: Obstetrics and Gynecology | Admitting: Obstetrics and Gynecology

## 2022-11-29 DIAGNOSIS — Z1231 Encounter for screening mammogram for malignant neoplasm of breast: Secondary | ICD-10-CM

## 2022-11-29 DIAGNOSIS — Z780 Asymptomatic menopausal state: Secondary | ICD-10-CM

## 2023-01-20 ENCOUNTER — Other Ambulatory Visit: Payer: No Typology Code available for payment source

## 2024-01-09 IMAGING — MG MM DIGITAL SCREENING BILAT W/ TOMO AND CAD
8 series · 9 of 24 positions shown · non-contrast
Comparison: Previous exam(s).

CLINICAL DATA: Screening.

EXAM:
DIGITAL SCREENING BILATERAL MAMMOGRAM WITH TOMOSYNTHESIS AND CAD
TECHNIQUE: Bilateral screening digital craniocaudal and mediolateral oblique
mammograms were obtained. Bilateral screening digital breast
tomosynthesis was performed. The images were evaluated with
computer-aided detection.

[R MLO synth-2D]
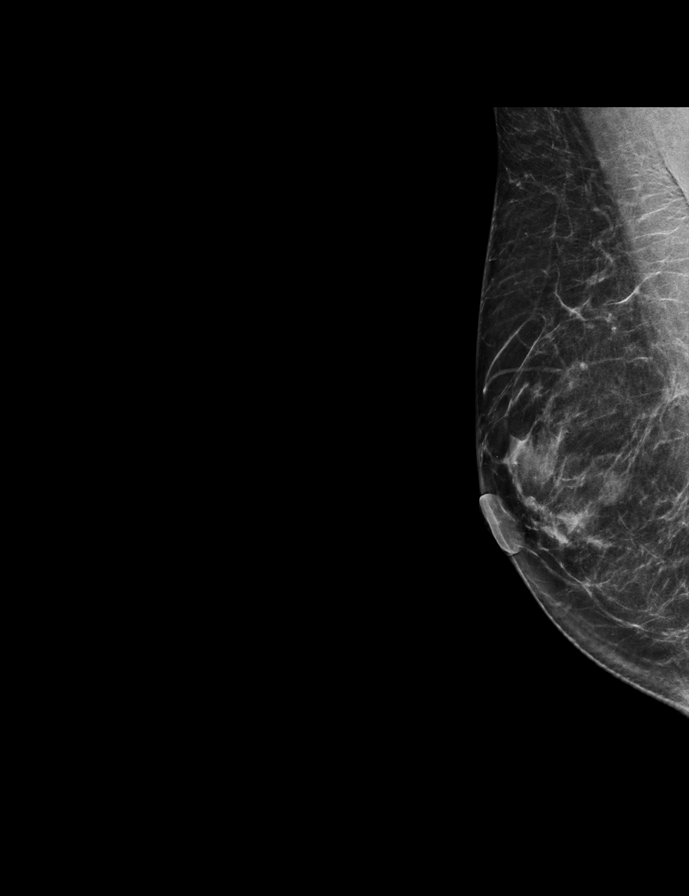

[L CC synth-2D]
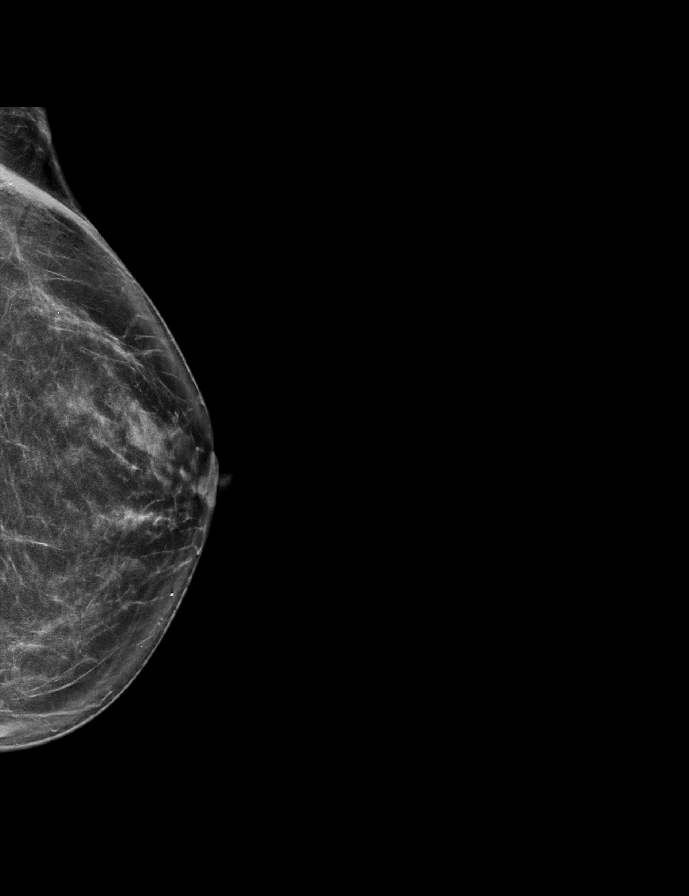

[L MLO synth-2D]
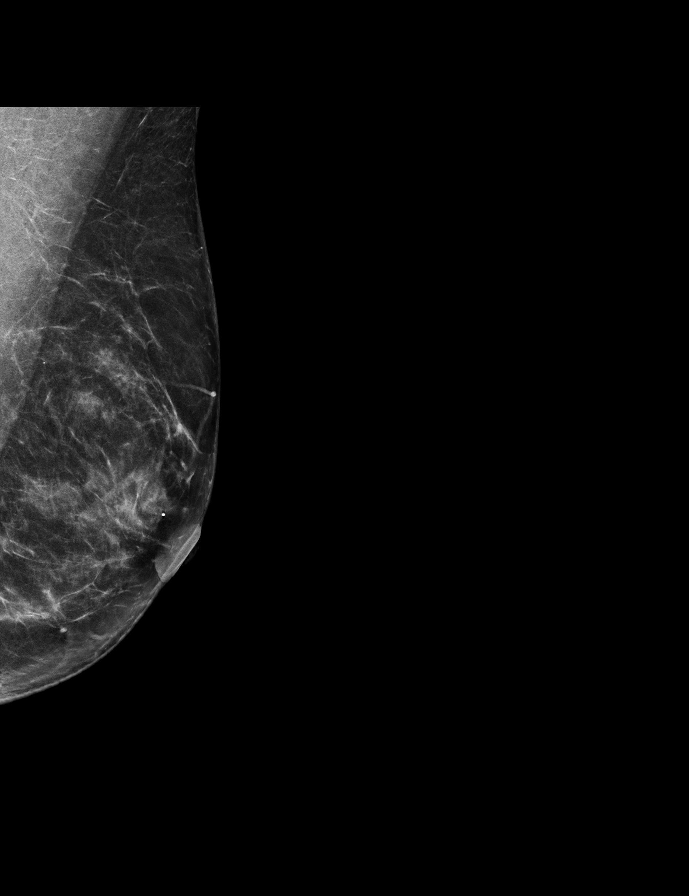

[R CC synth-2D]
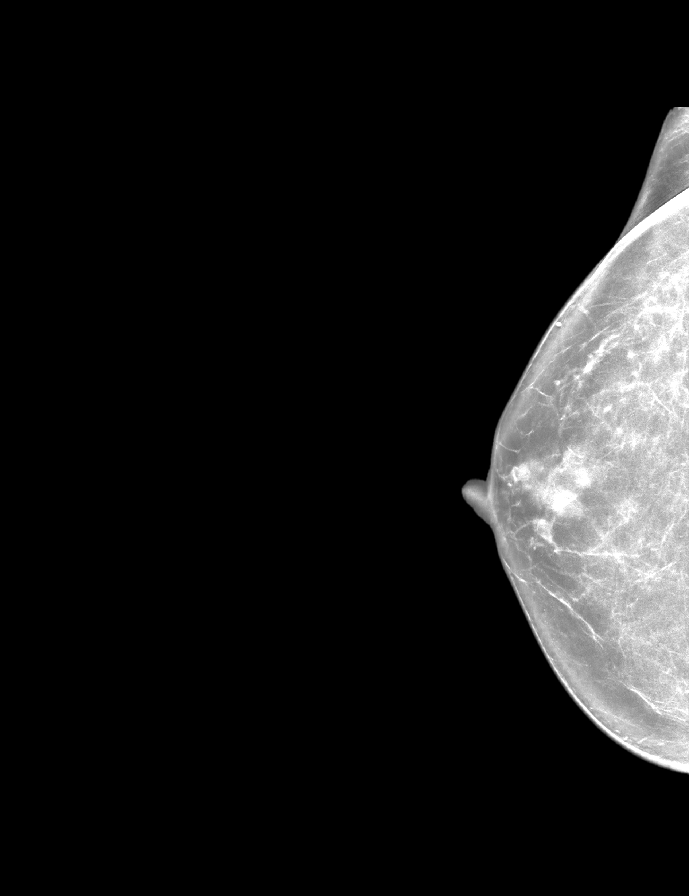

[R CC tomo · 2 of 67 frames shown]
[frame 22/67]
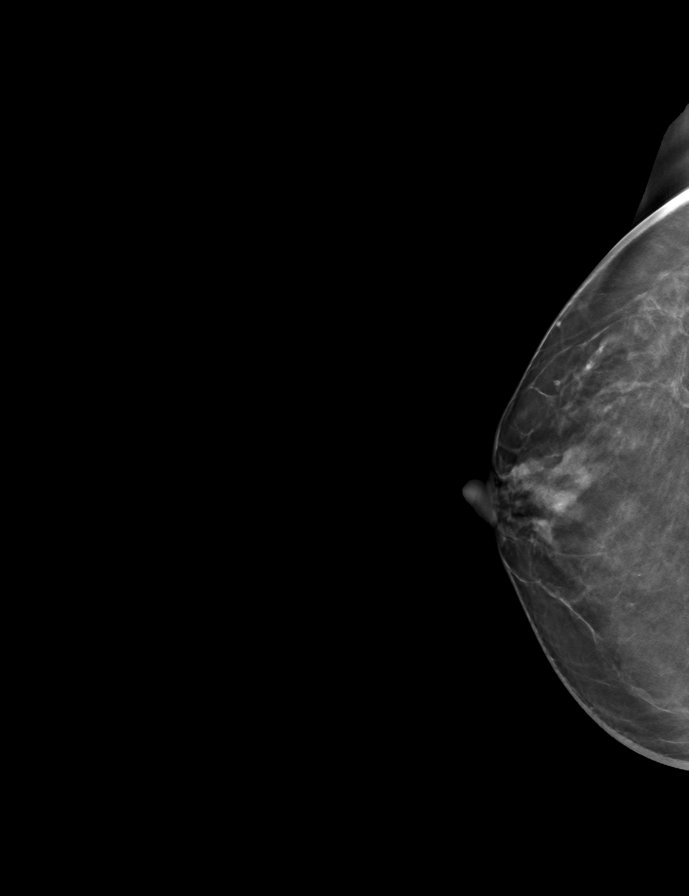
[frame 34/67]
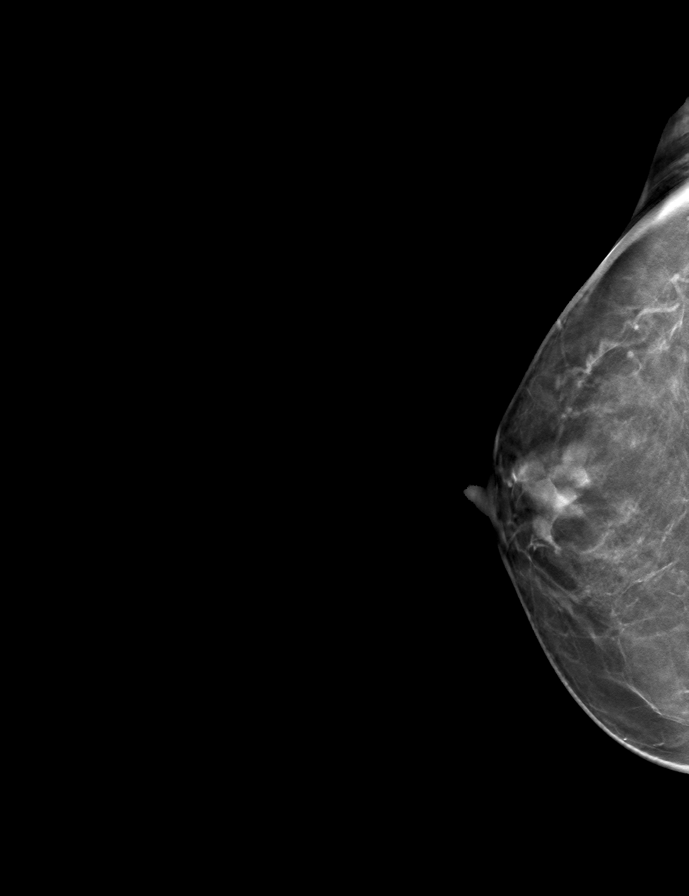

[L CC tomo · tomo slice 33/66.0]
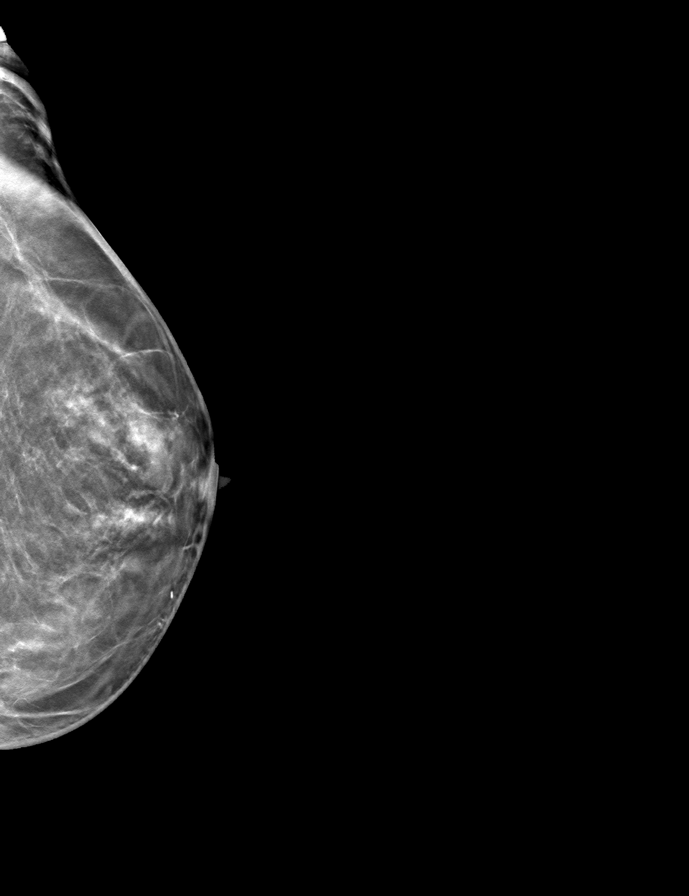

[R MLO tomo · tomo slice 33/65.0]
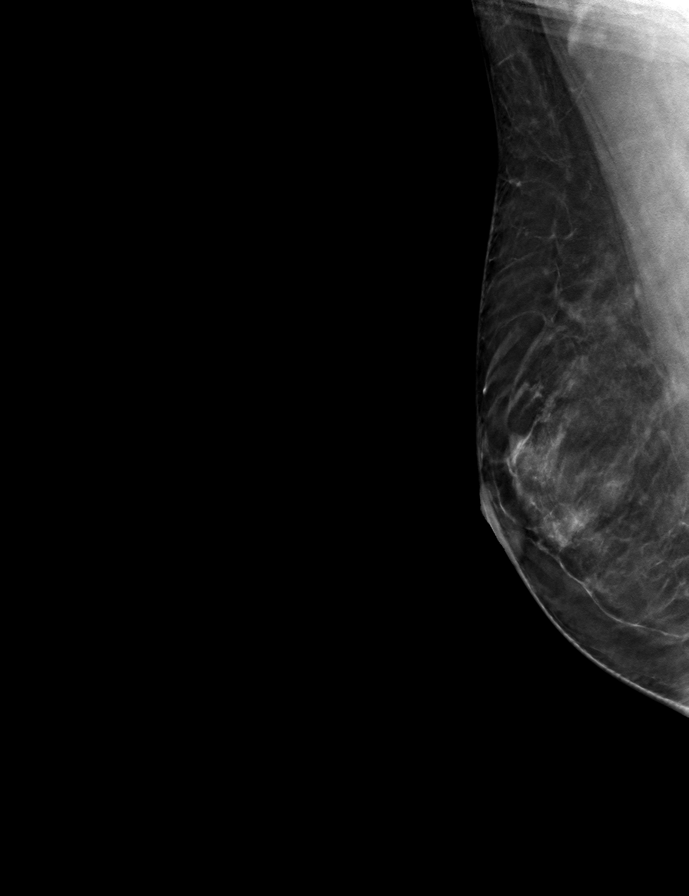

[L MLO tomo · tomo slice 32/63.0]
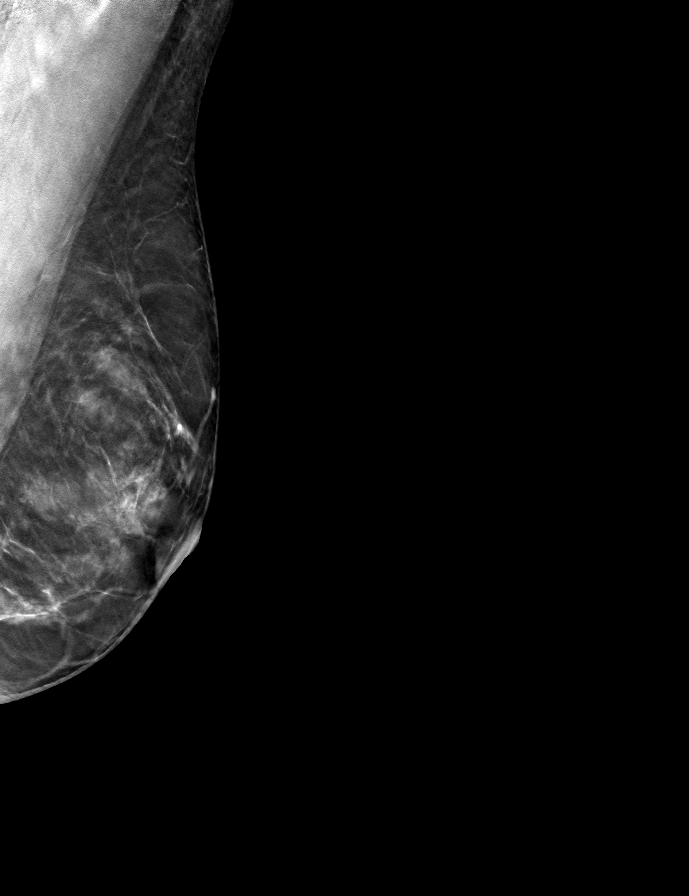

[9 of 24 positions shown; findings below may reference images not displayed]

ACR Breast Density Category b: There are scattered areas of
fibroglandular density.
FINDINGS: There are no findings suspicious for malignancy.
IMPRESSION: No mammographic evidence of malignancy. A result letter of this
screening mammogram will be mailed directly to the patient.

RECOMMENDATION:
Screening mammogram in one year. (Code:51-O-LD2)

BI-RADS CATEGORY  1: Negative.
